# Patient Record
Sex: Male | Born: 1955 | Race: White | Hispanic: No | Marital: Married | Smoking: Former smoker
Health system: Southern US, Community
[De-identification: ages and names within clinical notes are randomized; demographics above are authoritative.]

---

## 2003-02-21 ENCOUNTER — Emergency Department (HOSPITAL_COMMUNITY): Admission: EM | Admit: 2003-02-21 | Discharge: 2003-02-22 | Payer: Self-pay | Admitting: Emergency Medicine

## 2003-02-21 ENCOUNTER — Encounter: Payer: Self-pay | Admitting: Emergency Medicine

## 2003-07-27 ENCOUNTER — Ambulatory Visit (HOSPITAL_COMMUNITY): Admission: RE | Admit: 2003-07-27 | Discharge: 2003-07-27 | Payer: Self-pay | Admitting: Family Medicine

## 2003-08-27 ENCOUNTER — Ambulatory Visit (HOSPITAL_COMMUNITY): Admission: RE | Admit: 2003-08-27 | Discharge: 2003-08-27 | Payer: Self-pay | Admitting: General Surgery

## 2008-04-16 ENCOUNTER — Ambulatory Visit (HOSPITAL_COMMUNITY): Admission: RE | Admit: 2008-04-16 | Discharge: 2008-04-16 | Payer: Self-pay | Admitting: General Surgery

## 2010-11-07 NOTE — H&P (Signed)
NAME:  Ryan Farrell, Ryan Farrell              ACCOUNT NO.:  [id number]   MEDICAL RECORD NO.:  [id number]          PATIENT TYPE:  AMB   LOCATION:  DAY                           FACILITY:  [facility]   PHYSICIAN:  Dalia Heading, M.D.  DATE OF BIRTH:  October 12, 1955   DATE OF ADMISSION:  DATE OF DISCHARGE:  LH                              HISTORY & PHYSICAL   CHIEF COMPLAINT:  Need for screening colonoscopy.   HISTORY OF PRESENT ILLNESS:  The patient is a 55 year old white male who  is here for endoscopic evaluation.  He needs colonoscopy for screening  purposes.  No abdominal pain, weight loss, nausea, pain, diarrhea,  constipation, melena, hematochezia have been noted.  He has never had a  colonoscopy.   FAMILY HISTORY:  There is no family history of colon carcinoma.   PAST MEDICAL HISTORY:  Unremarkable.   PAST SURGICAL HISTORY:  Anal surgery.   CURRENT MEDICATIONS:  None.   ALLERGIES:  No known drug allergies.   REVIEW OF SYSTEMS:  Noncontributory.   PHYSICAL EXAMINATION:  GENERAL:  The patient is a well-developed and  well-nourished white male, in no acute distress.  LUNGS:  Clear to auscultation with equal breath sounds bilaterally.  CARDIAC:  Regular rate and rhythm with the no rubs, gallops, or murmurs.  ABDOMEN:  Soft, nontender, and nondistended.  No hepatosplenomegaly or  masses noted.  RECTAL:  Deferred to the procedure.   IMPRESSION:  Need for screening colonoscopy.   PLAN:  The patient is scheduled for a colonoscopy on April 16, 2008.  The risks and benefits of the procedure including bleeding and  perforation were fully explained to the patient, gave informed consent.      Dalia Heading, M.D.  Electronically Signed     MAJ/MEDQ  D:  03/30/2008  T:  03/31/2008  Job:  [id number]   cc:   Patrica Duel, M.D.  Fax: [phone]

## 2010-11-10 NOTE — Op Note (Signed)
NAME:  Ryan Farrell, Ryan Farrell                        ACCOUNT NO.:  [id number]   MEDICAL RECORD NO.:  [id number]                   PATIENT TYPE:  AMB   LOCATION:  DAY                                  FACILITY:  [facility]   PHYSICIAN:  Dalia Heading, M.D.               DATE OF BIRTH:  [birth year]   DATE OF PROCEDURE:  08/27/2003  DATE OF DISCHARGE:                                 OPERATIVE REPORT   PREOPERATIVE DIAGNOSES:  Extensive  hemorrhoidal disease.   POSTOPERATIVE DIAGNOSES:  Extensive  hemorrhoidal disease.   PROCEDURE:  Extensive hemorrhoidectomy.   SURGEON:  Dalia Heading, M.D.   ANESTHESIA:  General.   INDICATIONS FOR PROCEDURE:  The patient is a 55 year old white male who  presents with extensive hemorrhoidal disease which is causing pain and  bleeding. The risks and benefits of the procedure including bleeding,  infection and the possibility of recurrence of the hemorrhoidal disease were  fully explained to the office, who gave informed consent.   DESCRIPTION OF PROCEDURE:  The patient was placed in lithotomy position  after general anesthesia was administered. The perineum was prepped and  draped using the usual sterile technique with Betadine. Surgical site  confirmation was performed.   On rectal examination, the patient had prolapsing hemorrhoidal disease in a  triangular fashion at the 3 o'clock, 7 o'clock, and 11 o'clock positions.  No other mass lesions were noted. Both internal and external hemorrhoids  were noted.  All three areas were excised without difficulty.  There was  plenty of mucosa left in order to prevent stricture formation  postoperatively.  The hemorrhoids were excised and the mucosal edges  reapproximated using a 2-0 Vicryl running suture.  0.5% Sensorcaine was  instilled into the surrounding region. Surgicel and viscus Xylocaine was  then packed into the rectum. All tape and needle counts were correct at the  end of the procedure. The patient was  awakened and transferred to PACU in  stable condition.  Complications none.   SPECIMENS:  Hemorrhoids.   ESTIMATED BLOOD LOSS:  Minimal.      ___________________________________________                                            Dalia Heading, M.D.   MAJ/MEDQ  D:  08/27/2003  T:  08/27/2003  Job:  [id number]

## 2010-11-10 NOTE — H&P (Signed)
NAME:  Ryan Farrell, Ryan Farrell NO.:  [id number]   MEDICAL RECORD NO.:  [id number]                  PATIENT TYPE:   LOCATION:                                       FACILITY:   PHYSICIAN:  Dalia Heading, M.D.               DATE OF BIRTH:  1956-03-10   DATE OF ADMISSION:  08/27/2003  DATE OF DISCHARGE:                                HISTORY & PHYSICAL   CHIEF COMPLAINT:  Hemorrhoidal disease.   HISTORY OF PRESENT ILLNESS:  The patient is a 55 year old white male who is  referred for evaluation and treatment of hemorrhoidal disease.  He has had  problems with protrusion and swelling of the hemorrhoids for many years.  It  is now occurring every other week.  Painful swelling does occur.  No  medications have been helpful.  Minimal bleeding is noted.   PAST MEDICAL HISTORY:  Unremarkable.   PAST SURGICAL HISTORY:  Unremarkable.   CURRENT MEDICATIONS:  None.   ALLERGIES:  No known drug allergies.   SOCIAL HISTORY:  The patient does smoke some cigarettes.  He denies alcohol  use.   REVIEW OF SYSTEMS:  He denies any other cardiopulmonary difficulties or  bleeding disorders.   PHYSICAL EXAMINATION:  GENERAL:  On physical examination, the patient is a  well-developed well-nourished white male in no acute distress.  VITAL SIGNS:  He is afebrile and vital signs are stable.  LUNGS:  Clear to auscultation with equal breath sounds bilaterally.  HEART:  Heart examination reveals a regular rate and rhythm without S3, S4,  or murmurs.  ABDOMEN:  The abdomen is soft, nontender, nondistended.  RECTAL:  Examination reveals 2 thrombosed internal hemorrhoids noted along  the right lateral aspect of the anus.  No other masses are noted.  Sphincter  tone is normal. Stool is Hemoccult negative.   IMPRESSION:  Thrombosed internal hemorrhoids.   PLAN:  The patient is scheduled for a hemorrhoidectomy on August 27, 2003.  The risks and benefits of the procedure including  bleeding, infection, and  the possibility of the recurrence of the hemorrhoidal disease were fully  explained to the patient who gave informed consent.     ___________________________________________                                         Dalia Heading, M.D.   MAJ/MEDQ  D:  08/05/2003  T:  08/05/2003  Job:  [id number]   cc:   Dalia Heading, M.D.  [address] [place]  [place] [address]  Fax: [phone]   Patrica Duel, M.D.  [address], [address]  [place]  [place] [address]  Fax: [phone]

## 2011-03-20 ENCOUNTER — Ambulatory Visit (HOSPITAL_COMMUNITY)
Admission: RE | Admit: 2011-03-20 | Discharge: 2011-03-20 | Disposition: A | Discharge status: Home / Self Care | Payer: BC Managed Care – PPO | Source: Ambulatory Visit | Attending: Physician Assistant | Admitting: Physician Assistant

## 2011-03-20 ENCOUNTER — Other Ambulatory Visit (HOSPITAL_COMMUNITY): Payer: Self-pay | Admitting: Physician Assistant

## 2011-03-20 DIAGNOSIS — R05 Cough: Secondary | ICD-10-CM

## 2011-03-20 DIAGNOSIS — R059 Cough, unspecified: Secondary | ICD-10-CM | POA: Insufficient documentation

## 2011-03-20 DIAGNOSIS — R319 Hematuria, unspecified: Secondary | ICD-10-CM

## 2013-04-04 IMAGING — CR DG CHEST 2V
2 series · 2 of 2 positions shown · non-contrast
Comparison: None

CLINICAL DATA: Nonproductive cough

CHEST - 2 VIEW

[view not recorded (1 of 2)]
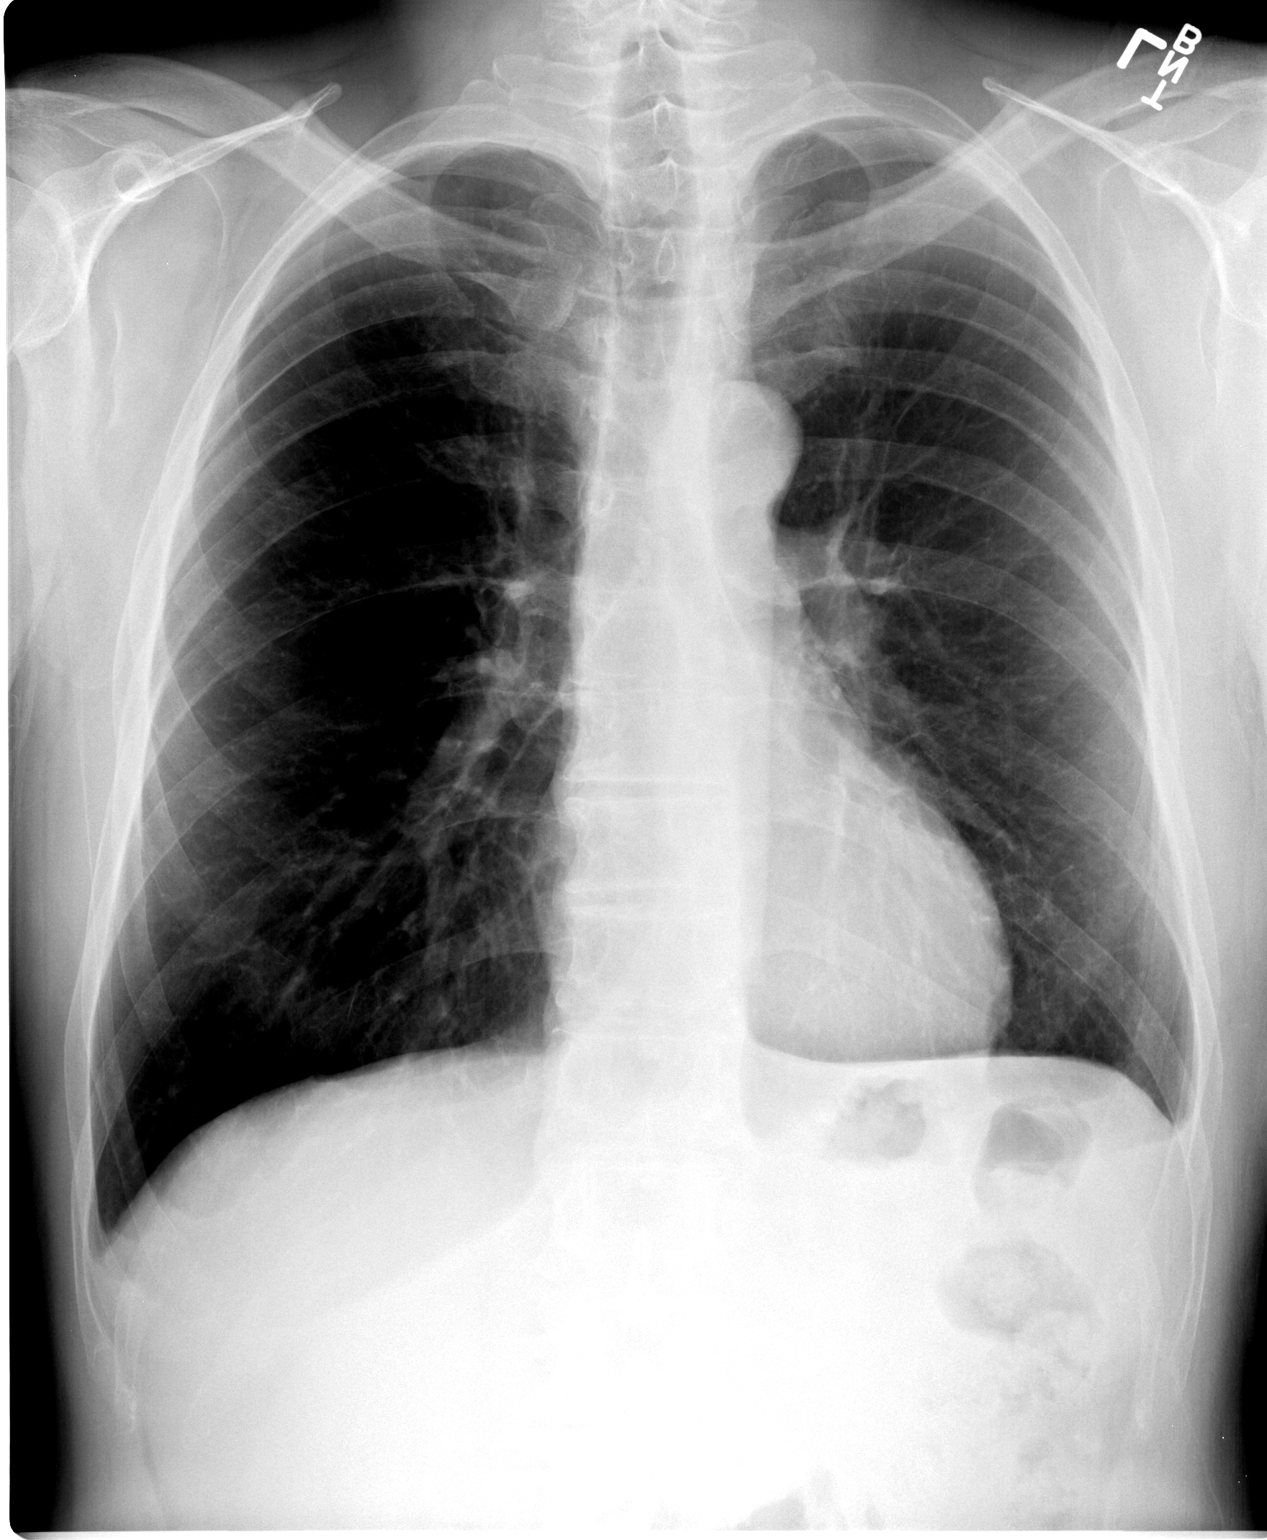

[view not recorded (2 of 2)]
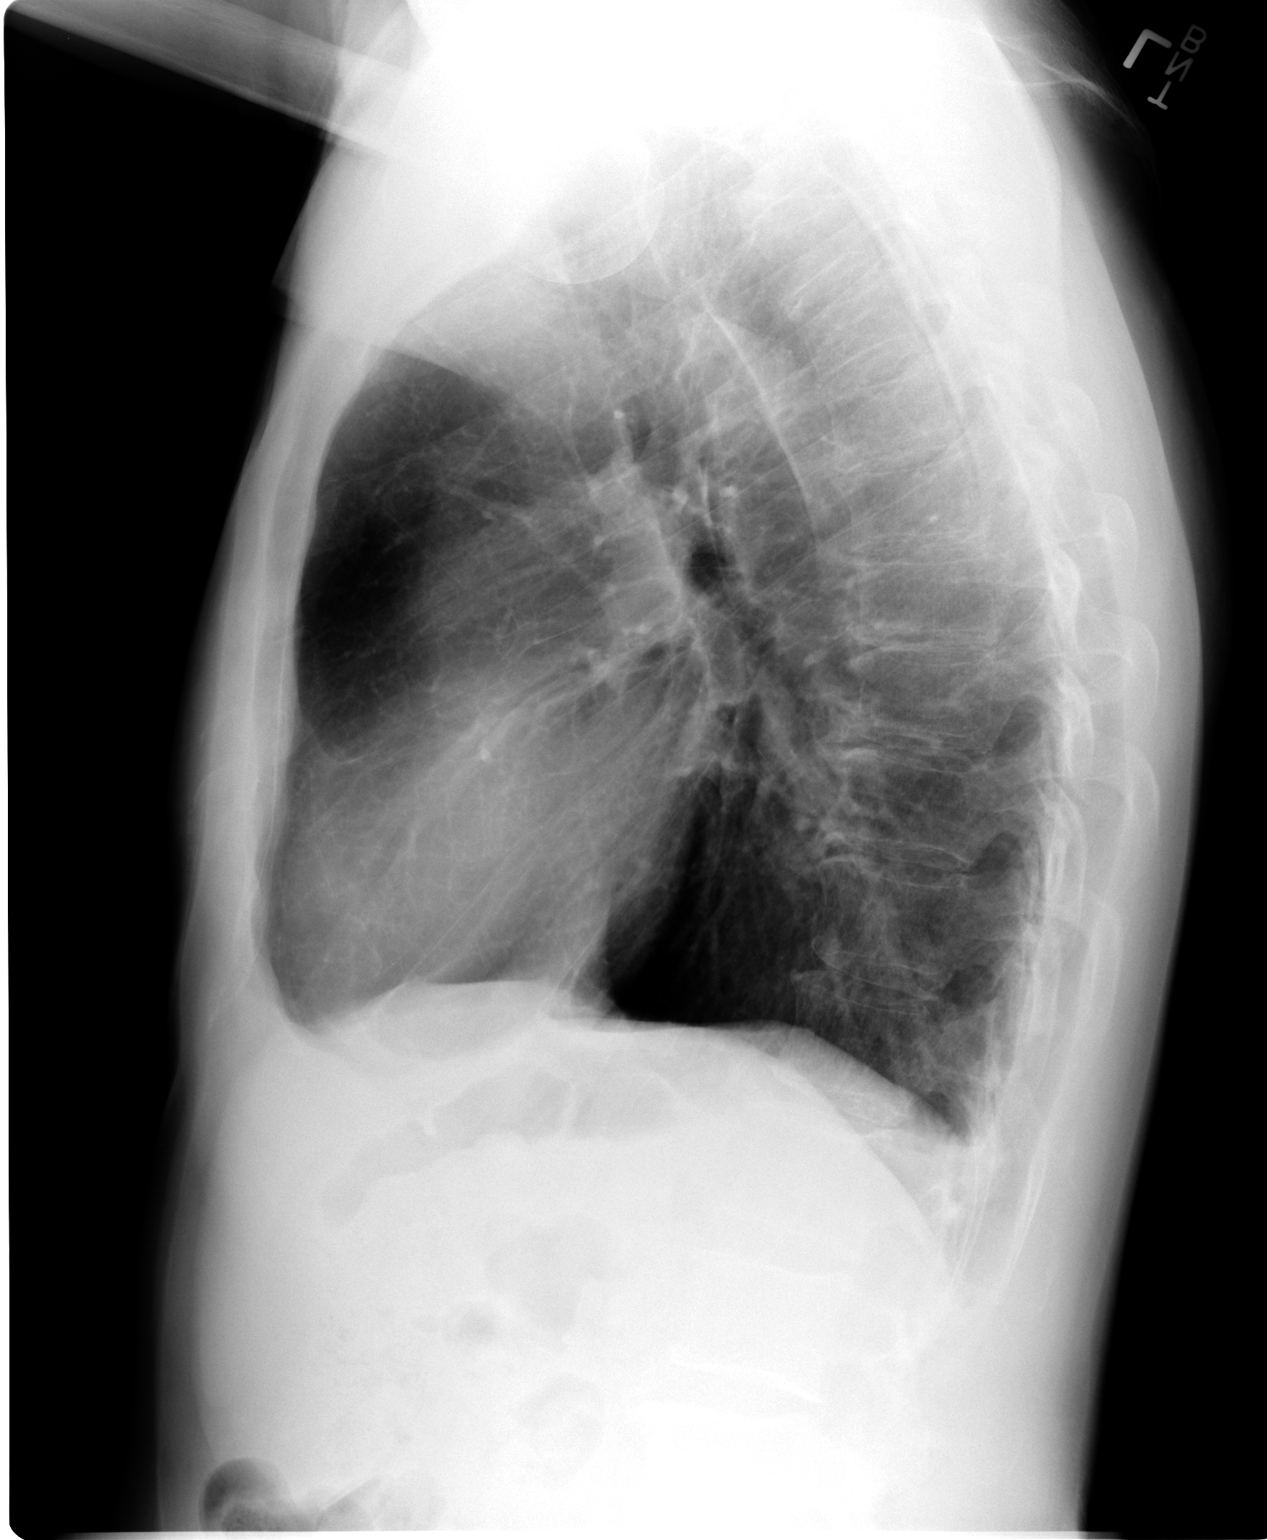

[2 of 2 positions shown; findings below may reference images not displayed]

FINDINGS: Pulmonary hyperinflation with changes of COPD.  Blunting
of the costophrenic angle on the left may be due to a small
effusion or pleural scarring.  Negative for pneumonia or mass
lesion.  Negative for heart failure.
IMPRESSION: COPD.

Small left effusion or pleural scarring.  Negative for pneumonia.

## 2019-05-29 ENCOUNTER — Other Ambulatory Visit: Payer: Self-pay

## 2019-05-29 DIAGNOSIS — Z20822 Contact with and (suspected) exposure to covid-19: Secondary | ICD-10-CM

## 2019-05-31 LAB — NOVEL CORONAVIRUS, NAA: SARS-CoV-2, NAA: NOT DETECTED

## 2019-06-01 ENCOUNTER — Telehealth: Payer: Self-pay | Admitting: *Deleted

## 2019-06-01 NOTE — Telephone Encounter (Signed)
Patient calling for COVID test results- notified negative.

## 2019-08-10 ENCOUNTER — Emergency Department (HOSPITAL_COMMUNITY): Payer: No Typology Code available for payment source

## 2019-08-10 ENCOUNTER — Emergency Department (HOSPITAL_COMMUNITY)
Admission: EM | Admit: 2019-08-10 | Discharge: 2019-08-10 | Disposition: A | Discharge status: Home / Self Care | Payer: No Typology Code available for payment source | Attending: Emergency Medicine | Admitting: Emergency Medicine

## 2019-08-10 ENCOUNTER — Other Ambulatory Visit: Payer: Self-pay

## 2019-08-10 ENCOUNTER — Encounter (HOSPITAL_COMMUNITY): Payer: Self-pay

## 2019-08-10 DIAGNOSIS — S62655B Nondisplaced fracture of medial phalanx of left ring finger, initial encounter for open fracture: Secondary | ICD-10-CM | POA: Diagnosis not present

## 2019-08-10 DIAGNOSIS — Y9389 Activity, other specified: Secondary | ICD-10-CM | POA: Diagnosis not present

## 2019-08-10 DIAGNOSIS — S67195A Crushing injury of left ring finger, initial encounter: Secondary | ICD-10-CM | POA: Diagnosis present

## 2019-08-10 DIAGNOSIS — W230XXA Caught, crushed, jammed, or pinched between moving objects, initial encounter: Secondary | ICD-10-CM | POA: Insufficient documentation

## 2019-08-10 DIAGNOSIS — Z23 Encounter for immunization: Secondary | ICD-10-CM | POA: Insufficient documentation

## 2019-08-10 DIAGNOSIS — S62667B Nondisplaced fracture of distal phalanx of left little finger, initial encounter for open fracture: Secondary | ICD-10-CM | POA: Diagnosis not present

## 2019-08-10 DIAGNOSIS — Y9289 Other specified places as the place of occurrence of the external cause: Secondary | ICD-10-CM | POA: Diagnosis not present

## 2019-08-10 DIAGNOSIS — Y990 Civilian activity done for income or pay: Secondary | ICD-10-CM | POA: Diagnosis not present

## 2019-08-10 DIAGNOSIS — T1490XA Injury, unspecified, initial encounter: Secondary | ICD-10-CM

## 2019-08-10 DIAGNOSIS — S60152A Contusion of left little finger with damage to nail, initial encounter: Secondary | ICD-10-CM | POA: Diagnosis not present

## 2019-08-10 MED ORDER — CEPHALEXIN 500 MG PO CAPS: 500.0000 mg | ORAL_CAPSULE | Freq: Four times a day (QID) | ORAL | 0 refills | Status: DC

## 2019-08-10 MED ORDER — TETANUS-DIPHTH-ACELL PERTUSSIS 5-2.5-18.5 LF-MCG/0.5 IM SUSP
0.5000 mL | Freq: Once | INTRAMUSCULAR | Status: AC
  Administered 2019-08-10: 13:00:00 0.5 mL via INTRAMUSCULAR
  Filled 2019-08-10: qty 0.5

## 2019-08-10 MED ORDER — LIDOCAINE HCL (PF) 1 % IJ SOLN
10.0000 mL | Freq: Once | INTRAMUSCULAR | Status: AC
  Administered 2019-08-10: 13:00:00 10 mL via INTRADERMAL
  Filled 2019-08-10: qty 30

## 2019-08-10 MED ORDER — OXYCODONE-ACETAMINOPHEN 5-325 MG PO TABS: 1.0000 | ORAL_TABLET | ORAL | 0 refills | Status: DC | PRN

## 2019-08-10 MED ORDER — CEFAZOLIN SODIUM-DEXTROSE 2-4 GM/100ML-% IV SOLN
2.0000 g | Freq: Once | INTRAVENOUS | Status: AC
  Administered 2019-08-10: 13:00:00 2 g via INTRAVENOUS
  Filled 2019-08-10: qty 100

## 2019-08-10 MED ORDER — OXYCODONE-ACETAMINOPHEN 5-325 MG PO TABS
1.0000 | ORAL_TABLET | Freq: Once | ORAL | Status: AC
  Administered 2019-08-10: 13:00:00 1 via ORAL
  Filled 2019-08-10: qty 1

## 2019-08-10 MED ORDER — POVIDONE-IODINE 10 % EX SOLN
CUTANEOUS | Status: DC | PRN
  Filled 2019-08-10: qty 15

## 2019-08-10 NOTE — ED Notes (Signed)
Pt transported to xray 

## 2019-08-10 NOTE — Discharge Instructions (Addendum)
Keep the fingers clean and bandaged.  You may clean the wounds with mild soap and water.  Keep them splinted.  Dr. Arita Miss wants to see you in his office on Wednesday.  Call his office to arrange an appointment time.  Take the antibiotic as directed.  You may elevate your left hand when possible to help with pain.

## 2019-08-10 NOTE — ED Triage Notes (Signed)
Pt presents to ED with laceration of left ring and pinky finger after it was slammed in tailgate at work, small amount of bleeding noted, dressing applied.

## 2019-08-10 NOTE — ED Provider Notes (Signed)
[facility] EMERGENCY DEPARTMENT Provider Note   CSN: [id number] Arrival date & time: 08/10/19  1036     History Chief Complaint  Patient presents with  . Laceration    Ryan Farrell is a 64 y.o. male.  HPI     Ryan Farrell is a 64 y.o. male who presents to the Emergency Department complaining of crush injuries and lacerations of the left ring and pinky finger.  Injuries occurred shortly before ER arrival.  He states that a coworker accidentally slammed a tailgate of a work truck on his fingers.  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  Patient states he was wearing work gloves at the time of the accident and when he removed his gloves he noticed a laceration to the tip of his pinky finger and 2 lacerations of the ring finger.  He reports bleeding to the fingers that was controlled with direct pressure and bandages.  He complains of throbbing pain to his fingers.  Last TD is unknown.  He denies numbness of his hand or fingers and wrist pain.  He denies any aspirin or blood thinners.   History reviewed. No pertinent past medical history.  There are no problems to display for this patient.   Past Surgical History:  Procedure Laterality Date  . HEMORROIDECTOMY         No family history on file.  Social History   Tobacco Use  . Smoking status: Former Research scientist (life sciences)  . Smokeless tobacco: Current User  Substance Use Topics  . Alcohol use: Not Currently  . Drug use: Not Currently    Home Medications Prior to Admission medications   Not on File    Allergies    Patient has no known allergies.  Review of Systems   Review of Systems  Constitutional: Negative for chills and fever.  Musculoskeletal: Negative for arthralgias (Pain of the left ring and fifth fingers), back pain and joint swelling.  Skin: Positive for wound (Lacerations of the left ring and fifth fingers).       Laceration   Neurological: Negative for dizziness, weakness and numbness.    Hematological: Does not bruise/bleed easily.    Physical Exam Updated Vital Signs BP 133/77 (BP Location: Right Arm)   Pulse 65   Temp 97.8 F (36.6 C) (Oral)   Resp 18   Ht 5\' 9"  (1.753 m)   Wt 72.6 kg   SpO2 98%   BMI 23.63 kg/m   Physical Exam Vitals and nursing note reviewed.  Constitutional:      General: He is not in acute distress.    Appearance: Normal appearance. He is well-developed. He is not toxic-appearing.  HENT:     Head: Atraumatic.  Cardiovascular:     Rate and Rhythm: Normal rate and regular rhythm.     Pulses: Normal pulses.  Pulmonary:     Effort: Pulmonary effort is normal. No respiratory distress.     Breath sounds: Normal breath sounds.  Musculoskeletal:        General: Signs of injury present. No tenderness.     Left hand: Laceration and bony tenderness present. Decreased range of motion. There is no disruption of two-point discrimination. Normal capillary refill.     Comments: ttp of the left mid ring finger and distal left fifth finger.  Lacerations of the dorsal and palmar aspects overlying the middle phalanx of the left fourth finger and laceration of the palmar aspect of the distal fifth finger.  Pt has  full flexion and extension of the PIP joint of the fourth finger and minimal palmar flexion of the DIP of the fifth finger. Fingers are warm and pink. See photos   Skin:    General: Skin is warm.     Capillary Refill: Capillary refill takes less than 2 seconds.  Neurological:     General: No focal deficit present.     Mental Status: He is alert.     Sensory: No sensory deficit.     Motor: No weakness or abnormal muscle tone.     Coordination: Coordination normal.         ED Results / Procedures / Treatments   Labs (all labs ordered are listed, but only abnormal results are displayed) Labs Reviewed - No data to display  EKG None  Radiology DG Hand Complete Left  Result Date: 08/10/2019 CLINICAL DATA:  Crush injury to ring finger  and little finger. EXAM: LEFT HAND - COMPLETE 3+ VIEW COMPARISON:  None. FINDINGS: Comminuted fracture noted in the diaphysis of the ring finger middle phalanx. There is an associated comminuted fracture in the tuft of the pinky finger distal phalanx. No evidence for retained radiopaque soft tissue foreign body. No other fracture or dislocation evident. IMPRESSION: Comminuted fractures involving the tuft of the pinky finger distal phalanx and midshaft ring finger middle phalanx. Electronically Signed   By: Kennith Center M.D.   On: 08/10/2019 11:21    PROCEDURE:   trepanation of nail   .Nail Removal  Date/Time: 08/10/2019 2:30 PM Performed by: Pauline Aus, PA-C Authorized by: Pauline Aus, PA-C   Consent:    Consent obtained:  Verbal   Consent given by:  Patient   Risks discussed:  Bleeding and infection   Alternatives discussed:  Delayed treatment Location:    Hand:  L small finger Pre-procedure details:    Skin preparation:  Betadine   Preparation: Patient was prepped and draped in the usual sterile fashion   Anesthesia (see MAR for exact dosages):    Anesthesia method:  Nerve block   Block location:  Left fifth finger   Block needle gauge:  25 G   Block anesthetic:  Lidocaine 1% w/o epi   Block technique:  Digital   Block injection procedure:  Anatomic landmarks identified and negative aspiration for blood   Block outcome:  Anesthesia achieved Nail Removal:    Nail removal amount: Subungual hematoma, nail not removed, nail intact. Trephination:    Subungual hematoma drained: yes     Trephination instrument:  Cautery Post-procedure details:    Dressing:  Splint and gauze roll   Patient tolerance of procedure:  Tolerated well, no immediate complications   (including critical care time)   LACERATION REPAIR #1 Performed by: Thayden Lemire Authorized by: Makayleigh Poliquin Consent: Verbal consent obtained. Risks and benefits: risks, benefits and alternatives were  discussed Consent given by: patient Patient identity confirmed: provided demographic data Prepped and Draped in normal sterile fashion Wound explored  Laceration Location: left ring finger  Laceration Length: 3.5 cm  No Foreign Bodies seen or palpated  Anesthesia: local infiltration  Local anesthetic: lidocaine 1 % w/o epinephrine  Anesthetic total: 3 ml  Irrigation method: syringe Amount of cleaning: standard  Skin closure: 4-0 prolene  Number of sutures: 7  Technique: simple interrupted  Patient tolerance: Patient tolerated the procedure well with no immediate complications.  LACERATION REPAIR #2 Performed by: Gustavia Carie Authorized by: Sorayah Schrodt Consent: Verbal consent obtained. Risks and benefits: risks, benefits and  alternatives were discussed Consent given by: patient Patient identity confirmed: provided demographic data Prepped and Draped in normal sterile fashion Wound explored  Laceration Location: left ring finger  Laceration Length: 3 cm  No Foreign Bodies seen or palpated  Anesthesia: local infiltration  Local anesthetic: lidocaine 1 % w/o epinephrine  Anesthetic total: 3 ml  Irrigation method: syringe Amount of cleaning: standard  Skin closure: 4-0 prolene  Number of sutures: 5  Technique: simple interrupted  Patient tolerance: Patient tolerated the procedure well with no immediate complications.    LACERATION REPAIR #3 Performed by: Shamica Moree Authorized by: Germaine Shenker Consent: Verbal consent obtained. Risks and benefits: risks, benefits and alternatives were discussed Consent given by: patient Patient identity confirmed: provided demographic data Prepped and Draped in normal sterile fashion Wound explored  Laceration Location: left fifth finger  Laceration Length: 2 cm  No Foreign Bodies seen or palpated  Anesthesia: digital block of fifth finger  Local anesthetic: lidocaine 1 % w/o  epinephrine  Anesthetic total: 2 ml  Irrigation method: syringe Amount of cleaning: standard  Skin closure: 4-0 prolene  Number of sutures: 5  Technique: simple interrupted  Patient tolerance: Patient tolerated the procedure well with no immediate complications.     Medications Ordered in ED Medications - No data to display  ED Course  I have reviewed the triage vital signs and the nursing notes.  Pertinent labs & imaging results that were available during my care of the patient were reviewed by me and considered in my medical decision making (see chart for details).    MDM Rules/Calculators/A&P                       Patient with open fractures of the left ring and fifth fingers.  Hemostasis obtained with wound closure.  Trepanation of the nail of the left fifth finger.  TD updated and IV Ancef given.  Wounds explored, no foreign body seen no obvious tendon injuries.  Patient has full flexion and extension of the fingers.  NV intact.     Consulted Hydrographic surveyor, Dr. Arita Miss.  Will see pt in his office on Wednesday.  Pt agrees to wound care instructions, sutures out in 8 days,.  rx written for pain medication and keflex.  Pt also given return precautions.    Final Clinical Impression(s) / ED Diagnoses Final diagnoses:  Open nondisplaced fracture of middle phalanx of left ring finger, initial encounter  Open nondisplaced fracture of distal phalanx of left little finger, initial encounter    Rx / DC Orders ED Discharge Orders    None       Pauline Aus, PA-C 08/12/19 0757    Donnetta Hutching, MD 08/14/19 1523

## 2019-08-12 ENCOUNTER — Ambulatory Visit (INDEPENDENT_AMBULATORY_CARE_PROVIDER_SITE_OTHER): Payer: No Typology Code available for payment source | Admitting: Plastic Surgery

## 2019-08-12 ENCOUNTER — Other Ambulatory Visit: Payer: Self-pay

## 2019-08-12 ENCOUNTER — Encounter: Payer: Self-pay | Admitting: Plastic Surgery

## 2019-08-12 VITALS — BP 177/89 | HR 57 | Temp 99.1°F | Ht 69.0 in | Wt 165.0 lb

## 2019-08-12 DIAGNOSIS — S62639A Displaced fracture of distal phalanx of unspecified finger, initial encounter for closed fracture: Secondary | ICD-10-CM

## 2019-08-12 NOTE — Progress Notes (Signed)
   Referring Provider Assunta Found, MD [address] [place],  [place] [address]   CC:  Chief Complaint  Patient presents with  . Advice Only    Patient here for follow up visit from ED on 08/10/19 and evaluation of injury of the left hand      Ryan Farrell is an 64 y.o. male.  HPI: Patient presents as follow-up from emergency room this past Monday for a crush injury to his left small and ring fingers.  It was a heavy tailgate that came down and crushed his hand.  He had soft tissue lacerations on both fingers that were repaired in the emergency room.  X-ray showed a small tuft fracture of the small finger and a incomplete nondisplaced fracture of the ring finger middle phalanx.  He states he was able to make a full fist and extend after the injury.  He has had pain since the injury but is not getting worse.  No Known Allergies  Outpatient Encounter Medications as of 08/12/2019  Medication Sig  . cephALEXin (KEFLEX) 500 MG capsule Take 1 capsule (500 mg total) by mouth 4 (four) times daily.  Marland Kitchen oxyCODONE-acetaminophen (PERCOCET/ROXICET) 5-325 MG tablet Take 1 tablet by mouth every 4 (four) hours as needed.   No facility-administered encounter medications on file as of 08/12/2019.     No past medical history on file.  Past Surgical History:  Procedure Laterality Date  . HEMORROIDECTOMY      No family history on file.  Social History   Social History Narrative  . Not on file     Review of Systems General: Denies fevers, chills, weight loss CV: Denies chest pain, shortness of breath, palpitations  Physical Exam Vitals with BMI 08/12/2019 08/10/2019  Height 5\' 9"  5\' 9"   Weight 165 lbs 160 lbs  BMI 24.36 23.62  Systolic 177 133  Diastolic 89 77  Pulse 57 65    General:  No acute distress,  Alert and oriented, Non-Toxic, Normal speech and affect Left hand: Fingers appear well perfused with normal capillary refill and a palp radial pulse.  Sensation is intact  throughout.  He is able to fully extend all fingers.  He is able to flex at the DIP and PIP joints of the small and ring finger.  Soft tissue lacerations are well approximated.  There is some questionably viable skin on either side of the lacerations in a few places.  The pulp of the small finger appears bruised but it is sensate and appears viable.  X-ray was reviewed which shows a tuft fracture of the small finger and an incomplete nondisplaced fracture of the middle phalanx of the ring finger  Assessment/Plan Patient presents after crush injury to his left small and ring fingers.  He appears to have small nondisplaced fractures and soft tissue lacerations with no other injuries to the soft tissue structures.  We will plan to leave the sutures in for 2 weeks.  I do think that it would benefit him to be splinted in the meantime.  According to his Worker's Comp. he does have an appointment with what I believe is a local orthopedic later this afternoon.  I explained he can follow-up with me or the orthopedic hand surgeon depending on his or Worker's Comp. preference.  For the meantime I will plan on seeing him again in 2 weeks.  08/12/2019, 2:30 PM

## 2019-08-24 ENCOUNTER — Telehealth: Payer: Self-pay | Admitting: Plastic Surgery

## 2019-08-24 NOTE — Telephone Encounter (Signed)
Ryan Farrell from [facility], [facility] adjuster, called [place] to advise that pt would be seeing ortho and would not be seeing Dr. Arita Miss anymore.

## 2019-08-27 ENCOUNTER — Ambulatory Visit: Payer: Self-pay | Admitting: Plastic Surgery

## 2021-06-07 ENCOUNTER — Other Ambulatory Visit: Payer: Self-pay | Admitting: *Deleted

## 2021-06-07 DIAGNOSIS — Z1211 Encounter for screening for malignant neoplasm of colon: Secondary | ICD-10-CM

## 2021-06-13 ENCOUNTER — Ambulatory Visit: Payer: BC Managed Care – PPO | Admitting: General Surgery

## 2021-06-13 ENCOUNTER — Encounter: Payer: Self-pay | Admitting: General Surgery

## 2021-06-13 ENCOUNTER — Other Ambulatory Visit: Payer: Self-pay

## 2021-06-13 VITALS — BP 174/81 | HR 90 | Temp 97.8°F | Resp 16 | Ht 69.0 in | Wt 165.0 lb

## 2021-06-13 DIAGNOSIS — Z1211 Encounter for screening for malignant neoplasm of colon: Secondary | ICD-10-CM | POA: Diagnosis not present

## 2021-06-13 MED ORDER — SUTAB 1479-225-188 MG PO TABS: 1.0000 | ORAL_TABLET | Freq: Once | ORAL | 0 refills | Status: DC

## 2021-06-13 NOTE — Progress Notes (Signed)
Ryan Farrell; [id number]; 07/20/1955   HPI Patient is a 65 year old white male who was referred to my care by Dr. Phillips Farrell for screening colonoscopy.  Patient last had a colonoscopy over 10 years ago.  He denies any family history of colon cancer.  He denies any blood per rectum, abnormal weight loss, abnormal abdominal pain, diarrhea, constipation. History reviewed. No pertinent past medical history.  Past Surgical History:  Procedure Laterality Date   HEMORROIDECTOMY      History reviewed. No pertinent family history.  Current Outpatient Medications on File Prior to Visit  Medication Sig Dispense Refill   tadalafil (CIALIS) 5 MG tablet Take 5 mg by mouth daily.     No current facility-administered medications on file prior to visit.    No Known Allergies  Social History   Substance and Sexual Activity  Alcohol Use Not Currently    Social History   Tobacco Use  Smoking Status Former  Smokeless Tobacco Current    Review of Systems  Constitutional: Negative.   HENT: Negative.    Eyes: Negative.   Respiratory: Negative.    Cardiovascular: Negative.   Gastrointestinal: Negative.   Genitourinary: Negative.   Musculoskeletal:  Positive for back pain and joint pain.  Skin: Negative.   Neurological: Negative.   Endo/Heme/Allergies: Negative.   Psychiatric/Behavioral: Negative.     Objective   Vitals:   06/13/21 1520  BP: (!) 174/81  Pulse: 90  Resp: 16  Temp: 97.8 F (36.6 C)  SpO2: 98%    Physical Exam Vitals reviewed.  Constitutional:      Appearance: Normal appearance. He is normal weight. He is not ill-appearing.  HENT:     Head: Normocephalic and atraumatic.  Cardiovascular:     Rate and Rhythm: Normal rate and regular rhythm.     Heart sounds: Normal heart sounds. No murmur heard.   No friction rub. No gallop.  Pulmonary:     Effort: Pulmonary effort is normal. No respiratory distress.     Breath sounds: Normal breath sounds. No stridor. No  wheezing, rhonchi or rales.  Abdominal:     General: Bowel sounds are normal. There is no distension.     Palpations: Abdomen is soft. There is no mass.     Tenderness: There is no abdominal tenderness. There is no guarding or rebound.     Hernia: No hernia is present.  Skin:    General: Skin is warm and dry.  Neurological:     Mental Status: He is alert and oriented to person, place, and time.   Primary care notes reviewed Assessment  Need for screening colonoscopy Plan  Patient is scheduled for screening colonoscopy on 06/23/2021.  The risks and benefits of the procedure including bleeding and perforation were fully explained to the patient, who gave informed consent.  Sutabs have been prescribed.

## 2021-06-13 NOTE — H&P (Signed)
Ryan Farrell; [id number]; 07/20/1955   HPI Patient is a 65 year old white male who was referred to my care by Dr. Phillips Odor for screening colonoscopy.  Patient last had a colonoscopy over 10 years ago.  He denies any family history of colon cancer.  He denies any blood per rectum, abnormal weight loss, abnormal abdominal pain, diarrhea, constipation. History reviewed. No pertinent past medical history.  Past Surgical History:  Procedure Laterality Date   HEMORROIDECTOMY      History reviewed. No pertinent family history.  Current Outpatient Medications on File Prior to Visit  Medication Sig Dispense Refill   tadalafil (CIALIS) 5 MG tablet Take 5 mg by mouth daily.     No current facility-administered medications on file prior to visit.    No Known Allergies  Social History   Substance and Sexual Activity  Alcohol Use Not Currently    Social History   Tobacco Use  Smoking Status Former  Smokeless Tobacco Current    Review of Systems  Constitutional: Negative.   HENT: Negative.    Eyes: Negative.   Respiratory: Negative.    Cardiovascular: Negative.   Gastrointestinal: Negative.   Genitourinary: Negative.   Musculoskeletal:  Positive for back pain and joint pain.  Skin: Negative.   Neurological: Negative.   Endo/Heme/Allergies: Negative.   Psychiatric/Behavioral: Negative.     Objective   Vitals:   06/13/21 1520  BP: (!) 174/81  Pulse: 90  Resp: 16  Temp: 97.8 F (36.6 C)  SpO2: 98%    Physical Exam Vitals reviewed.  Constitutional:      Appearance: Normal appearance. He is normal weight. He is not ill-appearing.  HENT:     Head: Normocephalic and atraumatic.  Cardiovascular:     Rate and Rhythm: Normal rate and regular rhythm.     Heart sounds: Normal heart sounds. No murmur heard.   No friction rub. No gallop.  Pulmonary:     Effort: Pulmonary effort is normal. No respiratory distress.     Breath sounds: Normal breath sounds. No stridor. No  wheezing, rhonchi or rales.  Abdominal:     General: Bowel sounds are normal. There is no distension.     Palpations: Abdomen is soft. There is no mass.     Tenderness: There is no abdominal tenderness. There is no guarding or rebound.     Hernia: No hernia is present.  Skin:    General: Skin is warm and dry.  Neurological:     Mental Status: He is alert and oriented to person, place, and time.   Primary care notes reviewed Assessment  Need for screening colonoscopy Plan  Patient is scheduled for screening colonoscopy on 06/23/2021.  The risks and benefits of the procedure including bleeding and perforation were fully explained to the patient, who gave informed consent.  Sutabs have been prescribed.

## 2021-06-23 ENCOUNTER — Encounter (HOSPITAL_COMMUNITY)
Admission: RE | Disposition: A | Discharge status: Home / Self Care | Payer: Self-pay | Source: Home / Self Care | Attending: General Surgery

## 2021-06-23 ENCOUNTER — Other Ambulatory Visit: Payer: Self-pay

## 2021-06-23 ENCOUNTER — Ambulatory Visit (HOSPITAL_COMMUNITY)
Admission: RE | Admit: 2021-06-23 | Discharge: 2021-06-23 | Disposition: A | Discharge status: Home / Self Care | Payer: BC Managed Care – PPO | Attending: General Surgery | Admitting: General Surgery

## 2021-06-23 ENCOUNTER — Encounter (HOSPITAL_COMMUNITY): Payer: Self-pay | Admitting: General Surgery

## 2021-06-23 DIAGNOSIS — Z1211 Encounter for screening for malignant neoplasm of colon: Secondary | ICD-10-CM

## 2021-06-23 DIAGNOSIS — K5730 Diverticulosis of large intestine without perforation or abscess without bleeding: Secondary | ICD-10-CM

## 2021-06-23 HISTORY — PX: COLONOSCOPY: SHX5424

## 2021-06-23 SURGERY — COLONOSCOPY
Anesthesia: Moderate Sedation

## 2021-06-23 MED ORDER — MEPERIDINE HCL 50 MG/ML IJ SOLN
INTRAMUSCULAR | Status: DC | PRN
  Administered 2021-06-23: 50 mg

## 2021-06-23 MED ORDER — MIDAZOLAM HCL 5 MG/5ML IJ SOLN
INTRAMUSCULAR | Status: AC
  Filled 2021-06-23: qty 5

## 2021-06-23 MED ORDER — MEPERIDINE HCL 50 MG/ML IJ SOLN
INTRAMUSCULAR | Status: AC
  Filled 2021-06-23: qty 1

## 2021-06-23 MED ORDER — SODIUM CHLORIDE 0.9 % IV SOLN: INTRAVENOUS | Status: DC

## 2021-06-23 MED ORDER — MIDAZOLAM HCL 5 MG/5ML IJ SOLN
INTRAMUSCULAR | Status: DC | PRN
  Administered 2021-06-23: 1 mg via INTRAVENOUS
  Administered 2021-06-23: 2 mg via INTRAVENOUS
  Administered 2021-06-23 (×2): 1 mg via INTRAVENOUS

## 2021-06-23 NOTE — Op Note (Signed)
[facility] Patient Name: Ryan Farrell Procedure Date: 06/23/2021 7:02 AM MRN: [id number] Date of Birth: December 04, 1955 Attending MD: Franky Macho , MD CSN: [id number] Age: 66 Admit Type: Outpatient Procedure:                Colonoscopy Indications:              Screening for colorectal malignant neoplasm Providers:                Franky Macho, MD, Sheran Fava, Dyann Ruddle Referring MD:              Medicines:                Midazolam 5 mg IV, Meperidine 50 mg IV Complications:            No immediate complications. Estimated Blood Loss:     Estimated blood loss: none. Procedure:                Pre-Anesthesia Assessment:                           - Prior to the procedure, a History and Physical                            was performed, and patient medications and                            allergies were reviewed. The patient is competent.                            The risks and benefits of the procedure and the                            sedation options and risks were discussed with the                            patient. All questions were answered and informed                            consent was obtained. Patient identification and                            proposed procedure were verified by the physician,                            the nurse and the technician in the endoscopy                            suite. Mental Status Examination: alert and                            oriented. Airway Examination: normal oropharyngeal                            airway and neck mobility. Respiratory Examination:  clear to auscultation. CV Examination: RRR, no                            murmurs, no S3 or S4. Prophylactic Antibiotics: The                            patient does not require prophylactic antibiotics.                            Prior Anticoagulants: The patient has taken no                            previous anticoagulant or antiplatelet  agents. ASA                            Grade Assessment: II - A patient with mild systemic                            disease. After reviewing the risks and benefits,                            the patient was deemed in satisfactory condition to                            undergo the procedure. The anesthesia plan was to                            use moderate sedation / analgesia (conscious                            sedation). Immediately prior to administration of                            medications, the patient was re-assessed for                            adequacy to receive sedatives. The heart rate,                            respiratory rate, oxygen saturations, blood                            pressure, adequacy of pulmonary ventilation, and                            response to care were monitored throughout the                            procedure. The physical status of the patient was                            re-assessed after the procedure.  After obtaining informed consent, the colonoscope                            was passed under direct vision. Throughout the                            procedure, the patient's blood pressure, pulse, and                            oxygen saturations were monitored continuously. The                            [id number]) scope was introduced through the                            anus and advanced to the the cecum, identified by                            the appendiceal orifice, ileocecal valve and                            palpation. No anatomical landmarks were                            photographed. The entire colon was well visualized.                            The colonoscopy was somewhat difficult due to                            multiple diverticula in the colon. The patient                            tolerated the procedure well. The quality of the                            bowel  preparation was adequate. The total duration                            of the procedure was 21 minutes. Scope In: 7:33:52 AM Scope Out: 7:53:29 AM Scope Withdrawal Time: 0 hours 3 minutes 9 seconds  Total Procedure Duration: 0 hours 19 minutes 37 seconds  Findings:      The perianal and digital rectal examinations were normal.      Multiple medium-mouthed diverticula were found in the sigmoid colon.       Estimated blood loss: none.      The exam was otherwise without abnormality on direct and retroflexion       views. Impression:               - Moderate diverticulosis in the sigmoid colon.                           - The examination was otherwise normal on direct  and retroflexion views.                           - No specimens collected. Moderate Sedation:      Moderate (conscious) sedation was administered by the endoscopy nurse       and supervised by the endoscopist. The following parameters were       monitored: oxygen saturation, heart rate, blood pressure, and response       to care. Recommendation:           - Written discharge instructions were provided to                            the patient.                           - The signs and symptoms of potential delayed                            complications were discussed with the patient.                           - Patient has a contact number available for                            emergencies.                           - Return to normal activities tomorrow.                           - Resume previous diet.                           - Continue present medications.                           - Repeat colonoscopy in 10 years for screening                            purposes. Procedure Code(s):        --- Professional ---                           [phone], Colonoscopy, flexible; diagnostic, including                            collection of specimen(s) by brushing or washing,                             when performed (separate procedure) Diagnosis Code(s):        --- Professional ---                           Z12.11, Encounter for screening for malignant                            neoplasm of colon  K57.30, Diverticulosis of large intestine without                            perforation or abscess without bleeding CPT copyright 2019 American Medical Association. All rights reserved. The codes documented in this report are preliminary and upon coder review may  be revised to meet current compliance requirements. Franky Macho, MD Franky Macho, MD 06/23/2021 7:57:21 AM This report has been signed electronically. Number of Addenda: 0

## 2021-06-23 NOTE — Interval H&P Note (Signed)
History and Physical Interval Note:  06/23/2021 7:27 AM  Ryan Farrell  has presented today for surgery, with the diagnosis of SCREENING COLONOSCOPY.  The various methods of treatment have been discussed with the patient and family. After consideration of risks, benefits and other options for treatment, the patient has consented to  Procedure(s): COLONOSCOPY (N/A) as a surgical intervention.  The patient's history has been reviewed, patient examined, no change in status, stable for surgery.  I have reviewed the patient's chart and labs.  Questions were answered to the patient's satisfaction.     Franky Macho

## 2021-06-29 ENCOUNTER — Encounter (HOSPITAL_COMMUNITY): Payer: Self-pay | Admitting: General Surgery

## 2021-08-25 IMAGING — DX DG HAND COMPLETE 3+V*L*
3 series · 3 of 3 positions shown · non-contrast
Comparison: None.

CLINICAL DATA: Crush injury to ring finger and little finger.

EXAM:
LEFT HAND - COMPLETE 3+ VIEW

[hand pa]
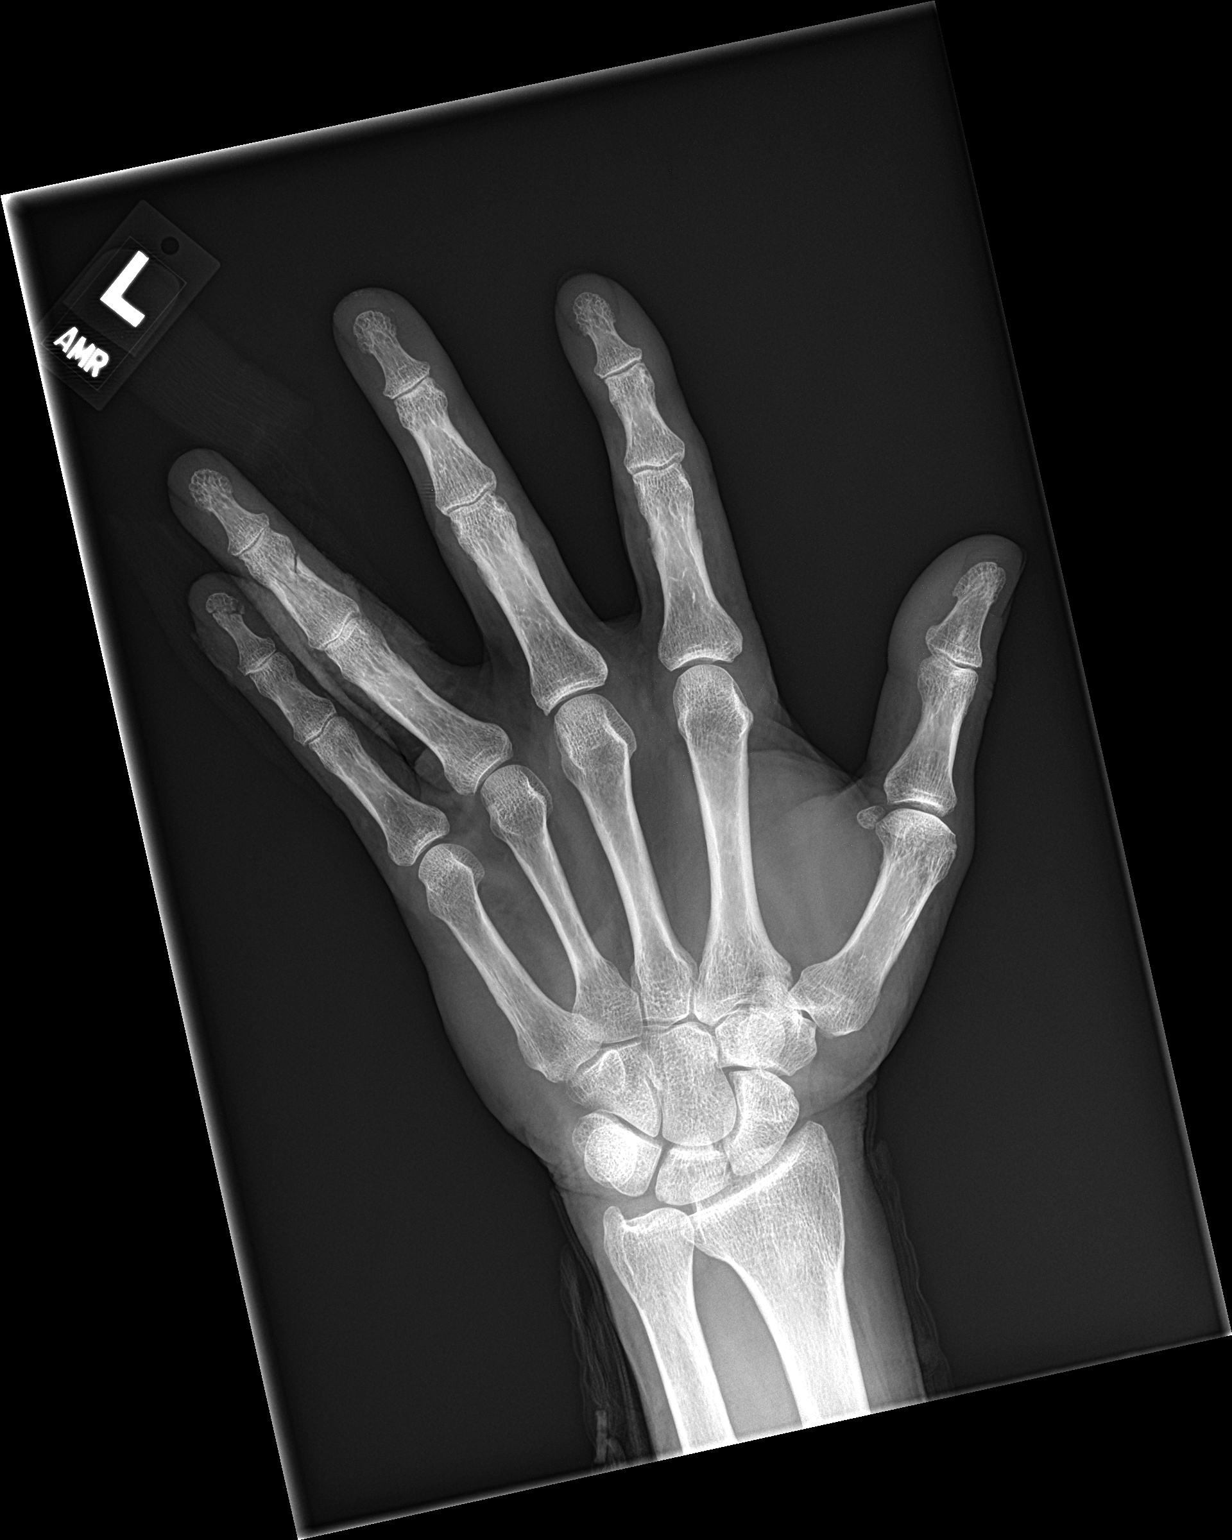

[hand obl]
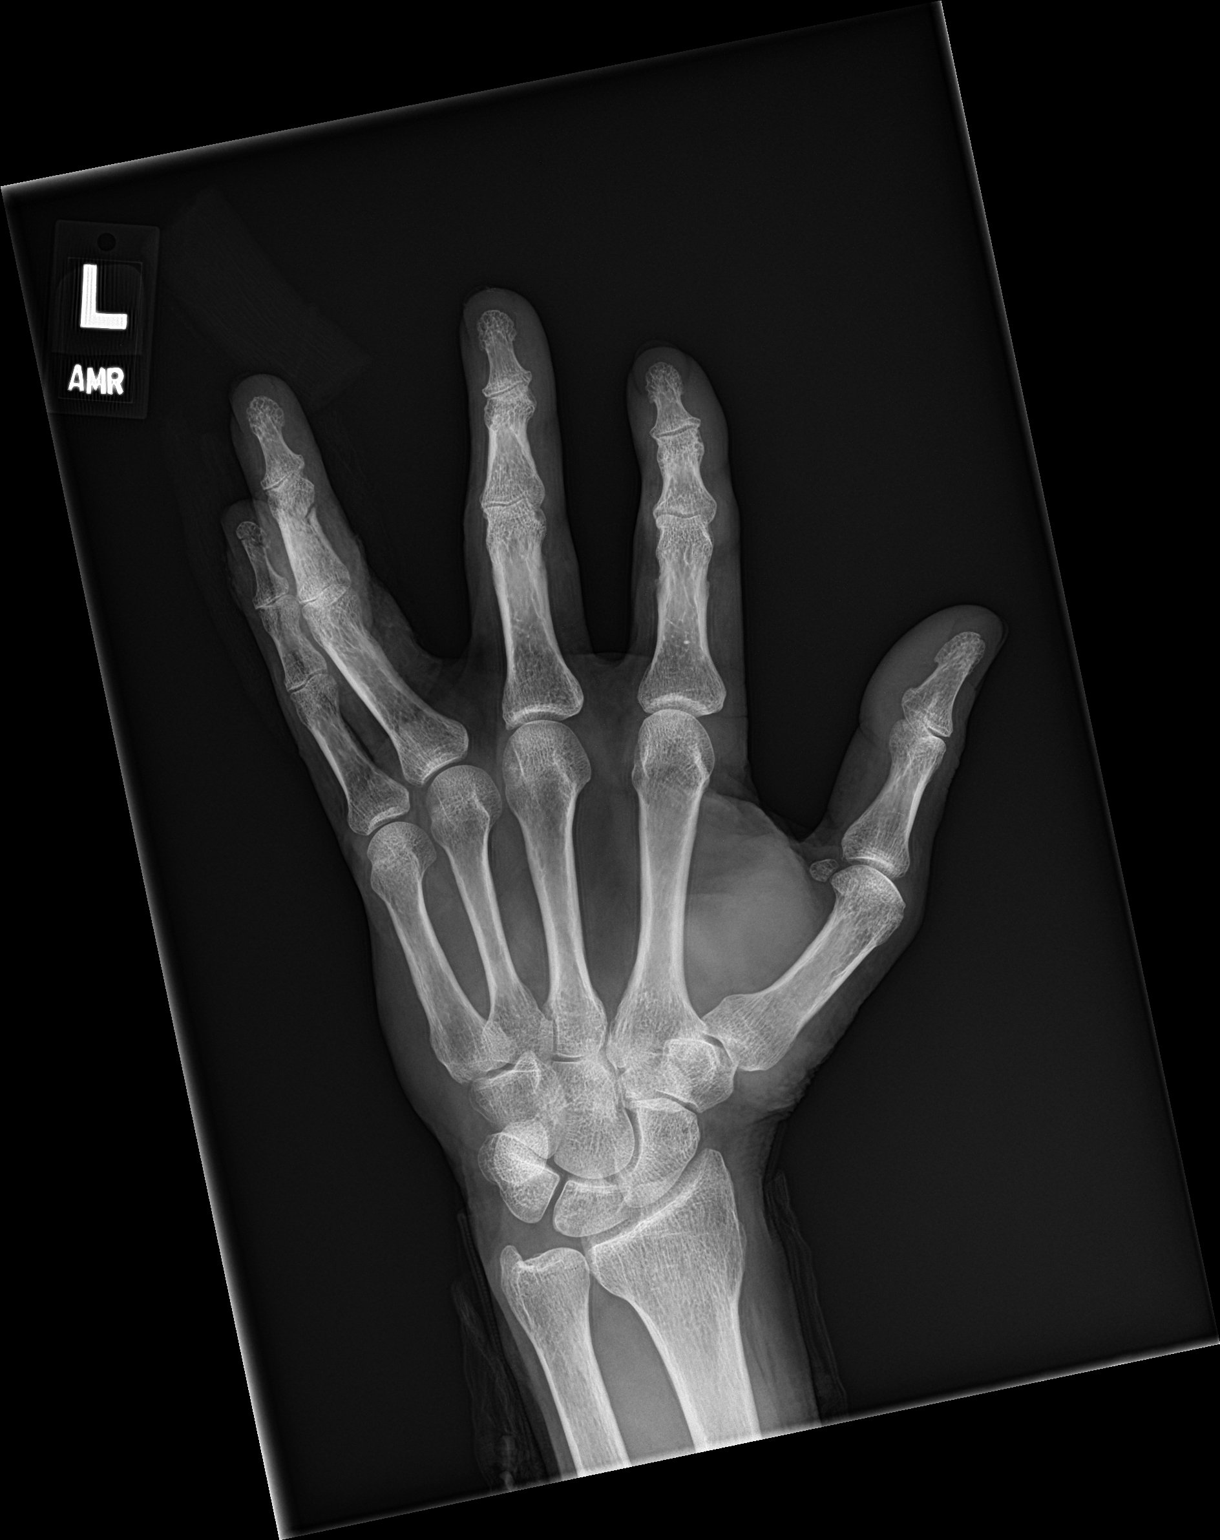

[hand lat]
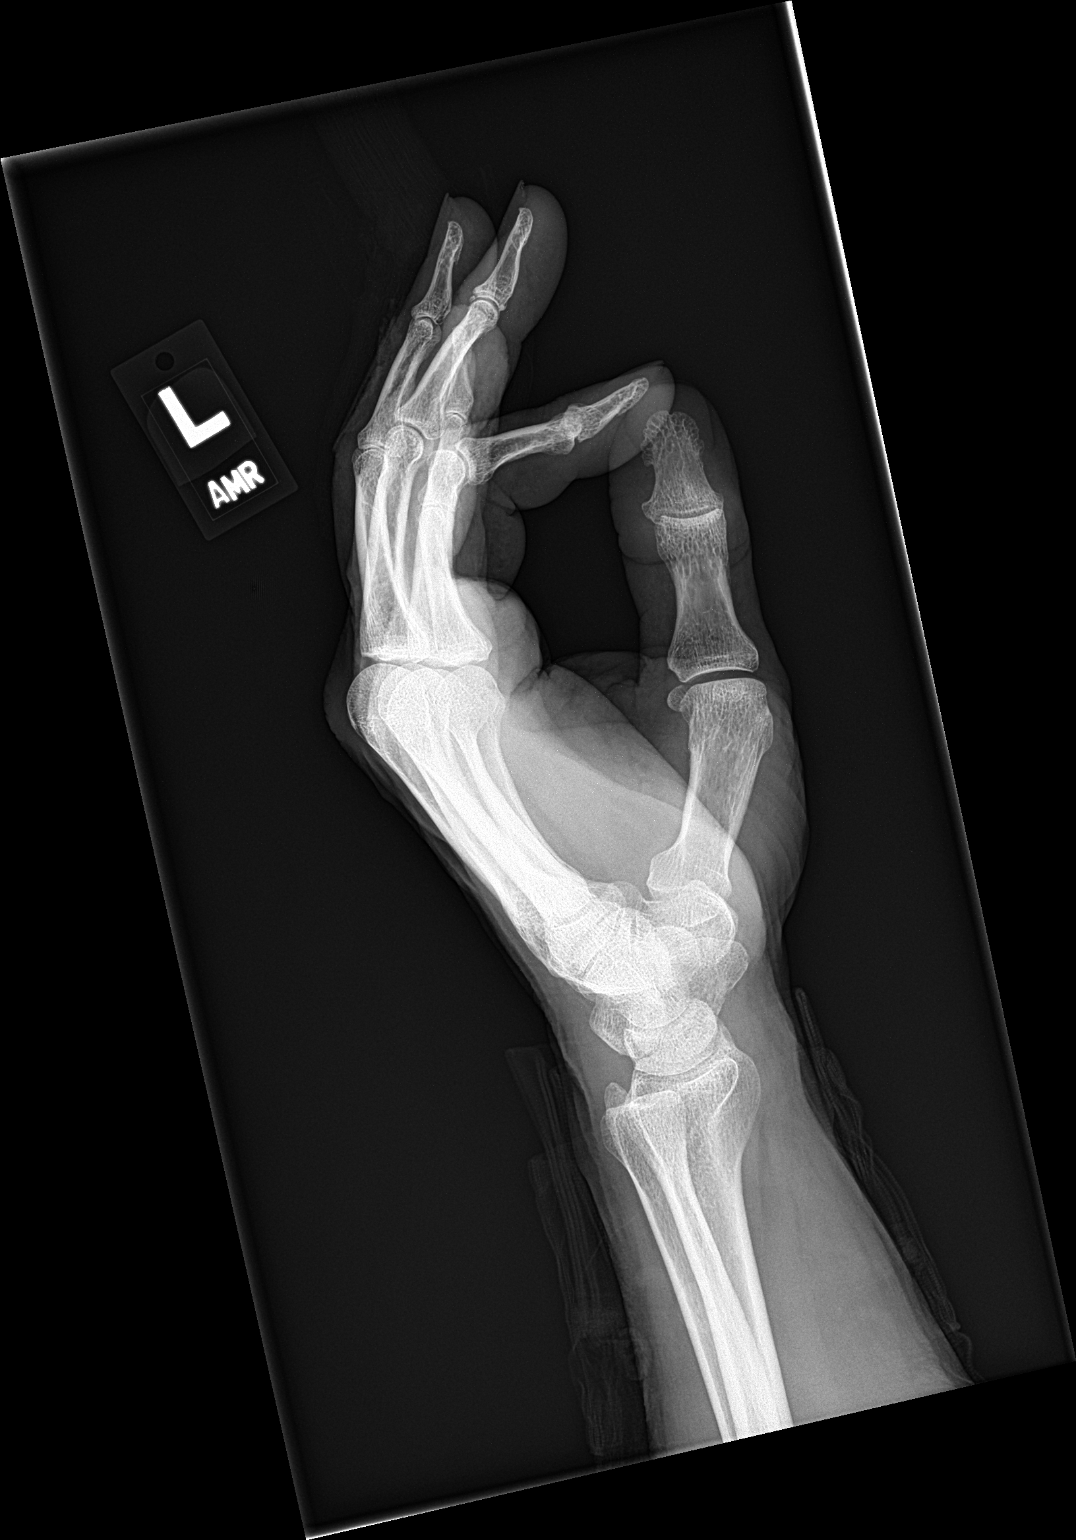

[3 of 3 positions shown; findings below may reference images not displayed]

FINDINGS: Comminuted fracture noted in the diaphysis of the ring finger middle
phalanx. There is an associated comminuted fracture in the tuft of
the pinky finger distal phalanx. No evidence for retained radiopaque
soft tissue foreign body. No other fracture or dislocation evident.
IMPRESSION: Comminuted fractures involving the tuft of the pinky finger distal
phalanx and midshaft ring finger middle phalanx.

## 2022-02-27 ENCOUNTER — Other Ambulatory Visit: Payer: Self-pay | Admitting: Family Medicine

## 2022-02-27 ENCOUNTER — Ambulatory Visit
Admission: RE | Admit: 2022-02-27 | Discharge: 2022-02-27 | Disposition: A | Discharge status: Home / Self Care | Payer: No Typology Code available for payment source | Source: Ambulatory Visit | Attending: Family Medicine | Admitting: Family Medicine

## 2022-02-27 DIAGNOSIS — W19XXXS Unspecified fall, sequela: Secondary | ICD-10-CM

## 2022-10-11 DIAGNOSIS — D225 Melanocytic nevi of trunk: Secondary | ICD-10-CM | POA: Diagnosis not present

## 2022-10-11 DIAGNOSIS — Z1283 Encounter for screening for malignant neoplasm of skin: Secondary | ICD-10-CM | POA: Diagnosis not present

## 2022-10-11 DIAGNOSIS — Z08 Encounter for follow-up examination after completed treatment for malignant neoplasm: Secondary | ICD-10-CM | POA: Diagnosis not present

## 2022-10-11 DIAGNOSIS — Z85820 Personal history of malignant melanoma of skin: Secondary | ICD-10-CM | POA: Diagnosis not present

## 2023-01-10 DIAGNOSIS — R001 Bradycardia, unspecified: Secondary | ICD-10-CM | POA: Diagnosis not present

## 2023-01-10 DIAGNOSIS — I499 Cardiac arrhythmia, unspecified: Secondary | ICD-10-CM | POA: Diagnosis not present

## 2023-01-21 DIAGNOSIS — R0683 Snoring: Secondary | ICD-10-CM | POA: Diagnosis not present

## 2023-01-21 DIAGNOSIS — R002 Palpitations: Secondary | ICD-10-CM | POA: Diagnosis not present

## 2023-01-21 DIAGNOSIS — R030 Elevated blood-pressure reading, without diagnosis of hypertension: Secondary | ICD-10-CM | POA: Diagnosis not present

## 2023-01-21 DIAGNOSIS — I499 Cardiac arrhythmia, unspecified: Secondary | ICD-10-CM | POA: Diagnosis not present

## 2023-01-21 DIAGNOSIS — R001 Bradycardia, unspecified: Secondary | ICD-10-CM | POA: Diagnosis not present

## 2023-01-24 DIAGNOSIS — R002 Palpitations: Secondary | ICD-10-CM | POA: Diagnosis not present

## 2023-01-24 DIAGNOSIS — R001 Bradycardia, unspecified: Secondary | ICD-10-CM | POA: Diagnosis not present

## 2023-01-24 DIAGNOSIS — I499 Cardiac arrhythmia, unspecified: Secondary | ICD-10-CM | POA: Diagnosis not present

## 2023-02-06 DIAGNOSIS — R002 Palpitations: Secondary | ICD-10-CM | POA: Diagnosis not present

## 2023-02-06 DIAGNOSIS — I499 Cardiac arrhythmia, unspecified: Secondary | ICD-10-CM | POA: Diagnosis not present

## 2023-02-06 DIAGNOSIS — R001 Bradycardia, unspecified: Secondary | ICD-10-CM | POA: Diagnosis not present

## 2023-02-08 DIAGNOSIS — I499 Cardiac arrhythmia, unspecified: Secondary | ICD-10-CM | POA: Diagnosis not present

## 2023-02-08 DIAGNOSIS — R002 Palpitations: Secondary | ICD-10-CM | POA: Diagnosis not present

## 2023-02-08 DIAGNOSIS — R001 Bradycardia, unspecified: Secondary | ICD-10-CM | POA: Diagnosis not present

## 2023-03-22 DIAGNOSIS — I491 Atrial premature depolarization: Secondary | ICD-10-CM | POA: Diagnosis not present

## 2023-03-22 DIAGNOSIS — I471 Supraventricular tachycardia, unspecified: Secondary | ICD-10-CM | POA: Diagnosis not present

## 2023-03-22 DIAGNOSIS — E7849 Other hyperlipidemia: Secondary | ICD-10-CM | POA: Diagnosis not present

## 2023-03-22 DIAGNOSIS — Z1330 Encounter for screening examination for mental health and behavioral disorders, unspecified: Secondary | ICD-10-CM | POA: Diagnosis not present

## 2023-03-22 DIAGNOSIS — R000 Tachycardia, unspecified: Secondary | ICD-10-CM | POA: Diagnosis not present

## 2023-03-22 DIAGNOSIS — R42 Dizziness and giddiness: Secondary | ICD-10-CM | POA: Diagnosis not present

## 2023-03-22 DIAGNOSIS — R001 Bradycardia, unspecified: Secondary | ICD-10-CM | POA: Diagnosis not present

## 2023-03-22 DIAGNOSIS — R002 Palpitations: Secondary | ICD-10-CM | POA: Diagnosis not present

## 2023-04-02 DIAGNOSIS — K760 Fatty (change of) liver, not elsewhere classified: Secondary | ICD-10-CM | POA: Diagnosis not present

## 2023-04-02 DIAGNOSIS — R001 Bradycardia, unspecified: Secondary | ICD-10-CM | POA: Diagnosis not present

## 2023-04-02 DIAGNOSIS — R079 Chest pain, unspecified: Secondary | ICD-10-CM | POA: Diagnosis not present

## 2023-04-02 DIAGNOSIS — I7121 Aneurysm of the ascending aorta, without rupture: Secondary | ICD-10-CM | POA: Diagnosis not present

## 2023-04-02 DIAGNOSIS — I471 Supraventricular tachycardia, unspecified: Secondary | ICD-10-CM | POA: Diagnosis not present

## 2023-04-02 DIAGNOSIS — I2510 Atherosclerotic heart disease of native coronary artery without angina pectoris: Secondary | ICD-10-CM | POA: Diagnosis not present

## 2023-04-02 DIAGNOSIS — I491 Atrial premature depolarization: Secondary | ICD-10-CM | POA: Diagnosis not present

## 2023-04-02 DIAGNOSIS — R42 Dizziness and giddiness: Secondary | ICD-10-CM | POA: Diagnosis not present

## 2023-04-02 DIAGNOSIS — R002 Palpitations: Secondary | ICD-10-CM | POA: Diagnosis not present

## 2023-04-02 DIAGNOSIS — R931 Abnormal findings on diagnostic imaging of heart and coronary circulation: Secondary | ICD-10-CM | POA: Diagnosis not present

## 2023-04-03 DIAGNOSIS — R931 Abnormal findings on diagnostic imaging of heart and coronary circulation: Secondary | ICD-10-CM | POA: Diagnosis not present

## 2023-04-09 DIAGNOSIS — R001 Bradycardia, unspecified: Secondary | ICD-10-CM | POA: Diagnosis not present

## 2023-04-09 DIAGNOSIS — R002 Palpitations: Secondary | ICD-10-CM | POA: Diagnosis not present

## 2023-04-09 DIAGNOSIS — Z0000 Encounter for general adult medical examination without abnormal findings: Secondary | ICD-10-CM | POA: Diagnosis not present

## 2023-04-09 DIAGNOSIS — Z23 Encounter for immunization: Secondary | ICD-10-CM | POA: Diagnosis not present

## 2023-04-09 DIAGNOSIS — I499 Cardiac arrhythmia, unspecified: Secondary | ICD-10-CM | POA: Diagnosis not present

## 2023-04-10 DIAGNOSIS — Z1330 Encounter for screening examination for mental health and behavioral disorders, unspecified: Secondary | ICD-10-CM | POA: Diagnosis not present

## 2023-04-10 DIAGNOSIS — R002 Palpitations: Secondary | ICD-10-CM | POA: Diagnosis not present

## 2023-04-18 DIAGNOSIS — Z1283 Encounter for screening for malignant neoplasm of skin: Secondary | ICD-10-CM | POA: Diagnosis not present

## 2023-04-18 DIAGNOSIS — D225 Melanocytic nevi of trunk: Secondary | ICD-10-CM | POA: Diagnosis not present

## 2023-04-18 DIAGNOSIS — Z86006 Personal history of melanoma in-situ: Secondary | ICD-10-CM | POA: Diagnosis not present

## 2023-04-18 DIAGNOSIS — Z08 Encounter for follow-up examination after completed treatment for malignant neoplasm: Secondary | ICD-10-CM | POA: Diagnosis not present

## 2023-06-11 DIAGNOSIS — H52223 Regular astigmatism, bilateral: Secondary | ICD-10-CM | POA: Diagnosis not present

## 2023-06-11 DIAGNOSIS — H5203 Hypermetropia, bilateral: Secondary | ICD-10-CM | POA: Diagnosis not present

## 2023-06-11 DIAGNOSIS — H524 Presbyopia: Secondary | ICD-10-CM | POA: Diagnosis not present

## 2023-10-03 DIAGNOSIS — Z6823 Body mass index (BMI) 23.0-23.9, adult: Secondary | ICD-10-CM | POA: Diagnosis not present

## 2023-10-03 DIAGNOSIS — Z7689 Persons encountering health services in other specified circumstances: Secondary | ICD-10-CM | POA: Diagnosis not present

## 2023-10-03 DIAGNOSIS — R002 Palpitations: Secondary | ICD-10-CM | POA: Diagnosis not present

## 2023-10-03 DIAGNOSIS — R7989 Other specified abnormal findings of blood chemistry: Secondary | ICD-10-CM | POA: Diagnosis not present

## 2023-10-03 DIAGNOSIS — R001 Bradycardia, unspecified: Secondary | ICD-10-CM | POA: Diagnosis not present

## 2023-10-10 DIAGNOSIS — Z0001 Encounter for general adult medical examination with abnormal findings: Secondary | ICD-10-CM | POA: Diagnosis not present

## 2023-10-10 DIAGNOSIS — Z6823 Body mass index (BMI) 23.0-23.9, adult: Secondary | ICD-10-CM | POA: Diagnosis not present

## 2023-10-10 DIAGNOSIS — R7989 Other specified abnormal findings of blood chemistry: Secondary | ICD-10-CM | POA: Diagnosis not present

## 2023-10-10 DIAGNOSIS — I471 Supraventricular tachycardia, unspecified: Secondary | ICD-10-CM | POA: Diagnosis not present

## 2023-10-10 DIAGNOSIS — R001 Bradycardia, unspecified: Secondary | ICD-10-CM | POA: Diagnosis not present

## 2024-02-13 ENCOUNTER — Ambulatory Visit (HOSPITAL_BASED_OUTPATIENT_CLINIC_OR_DEPARTMENT_OTHER): Admitting: Cardiology

## 2024-02-13 ENCOUNTER — Encounter (HOSPITAL_BASED_OUTPATIENT_CLINIC_OR_DEPARTMENT_OTHER): Payer: Self-pay | Admitting: Cardiology

## 2024-02-13 VITALS — BP 144/88 | HR 58 | Ht 69.0 in | Wt 161.1 lb

## 2024-02-13 DIAGNOSIS — I2510 Atherosclerotic heart disease of native coronary artery without angina pectoris: Secondary | ICD-10-CM | POA: Diagnosis not present

## 2024-02-13 DIAGNOSIS — Z7189 Other specified counseling: Secondary | ICD-10-CM | POA: Diagnosis not present

## 2024-02-13 DIAGNOSIS — Z8249 Family history of ischemic heart disease and other diseases of the circulatory system: Secondary | ICD-10-CM

## 2024-02-13 DIAGNOSIS — E7800 Pure hypercholesterolemia, unspecified: Secondary | ICD-10-CM | POA: Diagnosis not present

## 2024-02-13 DIAGNOSIS — Z712 Person consulting for explanation of examination or test findings: Secondary | ICD-10-CM | POA: Diagnosis not present

## 2024-02-13 DIAGNOSIS — I471 Supraventricular tachycardia, unspecified: Secondary | ICD-10-CM

## 2024-02-13 DIAGNOSIS — R002 Palpitations: Secondary | ICD-10-CM

## 2024-02-13 DIAGNOSIS — Z5181 Encounter for therapeutic drug level monitoring: Secondary | ICD-10-CM | POA: Diagnosis not present

## 2024-02-13 MED ORDER — ROSUVASTATIN CALCIUM 10 MG PO TABS: 10.0000 mg | ORAL_TABLET | Freq: Every day | ORAL | 3 refills | Status: AC

## 2024-02-13 MED ORDER — ASPIRIN 81 MG PO TBEC: 81.0000 mg | DELAYED_RELEASE_TABLET | Freq: Every day | ORAL | Status: AC

## 2024-02-13 NOTE — Patient Instructions (Signed)
 Medication Instructions:  START ASPIRIN  81 MG DAILY   START ROSUVASTATIN  10 MG DAILY   Labwork: FASTING LP/LFT'S/LPa IN ABOUT 2 MONTHS   Testing/Procedures: NONE  Follow-Up: DR LONNI RECHE ORN NP, OR MICHELLE S NP IN 6 MONTHS   Any Other Special Instructions Will Be Listed Below (If Applicable).  Recent data, summarized from NPR from a study from the [facility]:  [employer]  Researchers at the [facility] set out to answer this question by comparing statins to supplements in a clinical trial. They tracked the outcomes of 190 adults, ages 50 to 28. Some participants were given a 5 mg daily dose of rosuvastatin , a statin that is sold under the brand name Crestor  for 28 days. Others were given supplements, including fish oil, cinnamon, garlic, turmeric, plant sterols or red yeast rice for the same period.  What we found was that rosuvastatin  lowered LDL cholesterol by almost 61% and that was vastly superior to placebo and any of the six supplements studied in the trial, study dino Herlene Hood, M.D. of the [facility] told NPR. He says this level of reduction is enough to lower the risk of heart attacks and strokes. The findings are published in the Journal of the [employer] of Cardiology.  Oftentimes these supplements are marketed as 'natural ways' to lower your cholesterol, says Laffin. But he says none of the dietary supplements demonstrated any significant decrease in LDL cholesterol compared with a placebo. LDL cholesterol is considered the 'bad cholesterol' because it can contribute to plaque build-up in the artery walls - which can narrow the arteries, and set the stage for heart attacks and strokes

## 2024-02-13 NOTE — Progress Notes (Signed)
 Cardiology Office Note:  .   Date:  02/13/2024  ID:  Ryan Farrell, DOB Oct 26, 1955, MRN [id number] PCP: Marvine Rush, MD  [facility] Providers Cardiologist:  Shelda Bruckner, MD {  History of Present Illness: .   Ryan Farrell is a 68 y.o. male with PMH palpitations, pSVT, CAD on CT. He is seen as a new patient at the request of Carliss Batty, NP for evaluation of SVT.  Today: Reviewed notes from Carliss Batty, NP from 10/10/23. He noted palpitations, was previously seen at [facility] for paroxysmal atrial tachycardia. Reviewed [facility] cardiology notes, most recent 04/10/23. Also reviewed holter, echo, CT coronary from [facility] as well, see below.  For the last four months, he has been doing well. He cut out salt, processed food, eating healthier. Has not had a major episode since. Has cut way back on coffee, was a pot of coffee/day and now 1-2 cups/day.   First episode was July 2024. He was waking up with rapid and irregular heart beats.   We reviewed his holter, echo, and CT reports extensively.  He tolerated pravastatin 20 mg for about 6 mos and tolerated. Has never been on rosuvastatin .   FH: father died age 55 of MI, was smoker/drinker.   ROS: Denies chest pain, shortness of breath at rest or with normal exertion. No PND, orthopnea, LE edema or unexpected weight gain. No syncope or palpitations. ROS otherwise negative except as noted.   Studies Reviewed: SABRA    EKG:  EKG Interpretation Date/Time:  Thursday February 13 2024 15:53:56 EDT Ventricular Rate:  58 PR Interval:  122 QRS Duration:  112 QT Interval:  424 QTC Calculation: 416 R Axis:   48  Text Interpretation: Sinus bradycardia Confirmed by Bruckner Shelda [phone]) on 02/13/2024 4:05:11 PM    Holter 02/06/23:  The patient was monitored for a total of 6d 23h, starting on January 24, 2023.   Underlying rhythm is Sinus.   The minimum heart rate was 35 bpm at 2:16 am; the maximum 109 bpm; the average 52  bpm.   Patient remained bradycardic 80 % of total time.  Patient remained tachycardic 0.2 % of total time.   1 episode of wide complex tachycardia -  8 beat run with HR 127 bpm on Day 2 / 05:00:24 am. Patient was asymptomatic.   505 supraventricular episodes were found. Patient was asymptomatic.  Longest SVT episode 19 beat run with HR 116 bpm at 8:33 am. It appears like PACs, sinus tachycardia and 3 beat run of SVT with HR 140 and 3 beat run with HR about 155 bpm.  Fastest SVT 3 beat run with HR 190 bpm at 7:16 pm.   There were a total of 288 PVCs with 4 couplets. Overall PVC Burden at 0.05 %.  .  There were a total of 6,681 PSVCs with 523 couplets. Overall PSVC Burden at 1.25 %.   There is a total of 0 patient events.   The longest R-R interval is 1.92 seconds at 2:16 am.   The average Qtc is 419 msec. Min Qtc 401 msec, maximum Qtc 444 msec.  Qtc was > 460 msec 2.6 % of total time.   No A. fib, no long pauses 3 seconds or more noted.   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 The ascending aorta  is mildly dilated - 3.8 cm.    Left Ventricle: Systolic function is normal. EF: 65%.    Left Ventricle: There  is borderline hypertrophy.    Left Ventricle: Doppler parameters indicate normal diastolic function.    Left Ventricle: Wall motion is normal.    Tricuspid Valve: The right ventricular systolic pressure is normal (<36  mmHg).   No significant valvular disease was noted.   Coronary CT 04/02/23 IMPRESSION:  - Calcium  score of 353.  This places the patient in the 72nd percentile for age and race based on the [place] data base.  -  Normal course and trajectory of coronary arteries. Coronary artery disease with mild, 25-49% stenosis of the right coronary ostium. Small amount of soft tissue surrounding the proximal to mid right coronary artery within the epicardial fat, with differential including a small area of epicardial fat necrosis, soft plaque inflammation,  and myocardial bridging, without luminal abnormality. Calcified plaque and minimal luminal narrowing of the proximal LAD and circumflex. CAD RADS 2, P3.  -CT FFR indicated for further evaluation.  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  -Mild hepatic steatosis with probable small hepatic cyst.   FFRct interpretation:  1. The stenosis in the LAD has a low likelihood of lesion specific ischemia with an FFRct value in the distal perfusion territory of 0.81. Normal values in the proximal and mid vessel.  2. The questioned stenosis in the right coronary artery ostium has a low likelihood of lesion specific ischemia with an FFRct value of 0.93 in the distal perfusion territory.  3. The FFRct value of the circumflexis 0.89.   Physical Exam:   VS:  BP (!) 144/88   Pulse (!) 58   Ht 5' 9 (1.753 m)   Wt 161 lb 1.6 oz (73.1 kg)   SpO2 99%   BMI 23.79 kg/m    Wt Readings from Last 3 Encounters:  02/13/24 161 lb 1.6 oz (73.1 kg)  06/23/21 165 lb (74.8 kg)  06/13/21 165 lb (74.8 kg)    GEN: Well nourished, well developed in no acute distress HEENT: Normal, moist mucous membranes NECK: No JVD CARDIAC: regular rhythm, normal S1 and S2, no rubs or gallops. No murmur. VASCULAR: Radial and DP pulses 2+ bilaterally. No carotid bruits RESPIRATORY:  Clear to auscultation without rales, wheezing or rhonchi  ABDOMEN: Soft, non-tender, non-distended MUSCULOSKELETAL:  Ambulates independently SKIN: Warm and dry, no edema NEUROLOGIC:  Alert and oriented x 3. No focal neuro deficits noted. PSYCHIATRIC:  Normal affect    ASSESSMENT AND PLAN: .    Palpitations pSVT -much improved with lifestyle changes -reviewed monitor. Frequent but brief episodes -with resting bradycardia, would need to be cautious if beta blocker or calcium  channel trialed -can't use flecainide with CAD -if recurs and cannot be managed medically, would refer to EP to discuss ablation  CAD by CT  coronary Hypercholesterolemia Family history of heart disease -we discussed his CT results at length, recommendations for management, benefit to statin, extensive discussion about statin side effects -after shared decision making, willing to start rosuvastatin  10 mg daily. LDL goal <70. Recheck lipids in 2-3 months, if not at goal, increase to 20 mg daily -baseline lipids 09/2023 per [facility]: Tchol 184, HDL 50, LDL 118, TG 86 -given father's history, discussed lp(a), he is amenable -already on aspirin  81 mg daily, added to med list -reviewed red flag warning signs that need immediate medical attention  Elevated blood pressure reading without hypertension -well controlled on prior visits -follow with PCP  CV risk counseling and prevention -recommend heart healthy/Mediterranean diet, with whole grains, fruits, vegetable, fish, lean meats, nuts, and olive  oil. Limit salt. -recommend moderate walking, 3-5 times/week for 30-50 minutes each session. Aim for at least 150 minutes/week. Goal should be pace of 3 miles/hours, or walking 1.5 miles in 30 minutes -recommend avoidance of tobacco products. Avoid excess alcohol.  Dispo: 6 months, with labs in 2-3 months  Total time of encounter: I spent 63 minutes dedicated to the care of this patient on the date of this encounter to include pre-visit review of records, face-to-face time with the patient discussing conditions above, and clinical documentation with the electronic health record. We specifically spent time today discussing test results, management options, risks, medications at length as noted.   Signed, Shelda Bruckner, MD   Shelda Bruckner, MD, PhD, [facility] [place]  [facility]  [place]  [facility] at [facility] at [facility] [address] [place], [place] [address] [phone]

## 2024-04-01 DIAGNOSIS — I2510 Atherosclerotic heart disease of native coronary artery without angina pectoris: Secondary | ICD-10-CM | POA: Diagnosis not present

## 2024-04-01 DIAGNOSIS — I471 Supraventricular tachycardia, unspecified: Secondary | ICD-10-CM | POA: Diagnosis not present

## 2024-04-07 DIAGNOSIS — R7989 Other specified abnormal findings of blood chemistry: Secondary | ICD-10-CM | POA: Diagnosis not present

## 2024-04-07 DIAGNOSIS — Z6823 Body mass index (BMI) 23.0-23.9, adult: Secondary | ICD-10-CM | POA: Diagnosis not present

## 2024-04-07 DIAGNOSIS — I471 Supraventricular tachycardia, unspecified: Secondary | ICD-10-CM | POA: Diagnosis not present

## 2024-04-07 DIAGNOSIS — R001 Bradycardia, unspecified: Secondary | ICD-10-CM | POA: Diagnosis not present

## 2024-04-07 DIAGNOSIS — Z23 Encounter for immunization: Secondary | ICD-10-CM | POA: Diagnosis not present
